# Patient Record
Sex: Female | Born: 1995 | Race: White | Hispanic: No | Marital: Single | Smoking: Never smoker
Health system: Southern US, Community
[De-identification: ages and names within clinical notes are randomized; demographics above are authoritative.]

## PROBLEM LIST (undated history)

## (undated) DIAGNOSIS — F419 Anxiety disorder, unspecified: Secondary | ICD-10-CM

## (undated) DIAGNOSIS — H539 Unspecified visual disturbance: Secondary | ICD-10-CM

## (undated) DIAGNOSIS — Z68.41 Body mass index (BMI) pediatric, greater than or equal to 95th percentile for age: Secondary | ICD-10-CM

## (undated) DIAGNOSIS — L709 Acne, unspecified: Secondary | ICD-10-CM

## (undated) DIAGNOSIS — M4200 Juvenile osteochondrosis of spine, site unspecified: Secondary | ICD-10-CM

## (undated) DIAGNOSIS — R51 Headache: Secondary | ICD-10-CM

## (undated) DIAGNOSIS — F909 Attention-deficit hyperactivity disorder, unspecified type: Secondary | ICD-10-CM

## (undated) HISTORY — DX: Anxiety disorder, unspecified: F41.9

## (undated) HISTORY — PX: DENTAL SURGERY: SHX609

## (undated) HISTORY — DX: Unspecified visual disturbance: H53.9

## (undated) HISTORY — DX: Body mass index (bmi) pediatric, greater than or equal to 95th percentile for age: Z68.54

## (undated) HISTORY — DX: Attention-deficit hyperactivity disorder, unspecified type: F90.9

## (undated) HISTORY — DX: Juvenile osteochondrosis of spine, site unspecified: M42.00

## (undated) HISTORY — DX: Acne, unspecified: L70.9

---
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
## 2016-02-13 ENCOUNTER — Encounter: Payer: Self-pay | Admitting: Pediatrics

## 2016-02-13 NOTE — Progress Notes (Signed)
Pre-Visit Planning  Jane PughVirginia [name] Roger Carter  is Carter 20 y.o. female referred by Jane QuarryWISELTON,[name], MD.   Last seen in [facility] on 11/24/2015 for PCOS, acne and hirsutism.   Previous Psych Screenings? Yes, PHQSADs 11/2015  Treatment plan at last visit included cont retinA, add benzaclin, cont OCP and spironolactone.   Clinical Staff Visit Tasks:   - Urine GC/CT due? no - Psych Screenings Due? No  Provider Visit Tasks: - Assess PCOS symptoms - Assess medication compliance, benefits and side effects  - BHC Involvement? No - Pertinent Labs? No  PCOS Labs & Referrals:   - Hgba1c annually if normal, every 3 months if abnormal:  Due 08/2016 - CMP annually if normal, as needed if abnormal:  Due 08/2016 - CBC if on metformin, annually if normal, as needed if abnormal:  Due 08/2016 - Lipid every 2 years if normal, annually if abnormal:  Due 08/2017 - Vitamin D annually if normal, as needed if abnormal: Due 08/2016 - Nutrition referral: Previously declined, can re-address NOW - BH Screening: Due 11/2016

## 2016-02-16 ENCOUNTER — Ambulatory Visit (INDEPENDENT_AMBULATORY_CARE_PROVIDER_SITE_OTHER): Payer: 59 | Admitting: Pediatrics

## 2016-02-16 ENCOUNTER — Encounter: Payer: Self-pay | Admitting: Pediatrics

## 2016-02-16 ENCOUNTER — Encounter: Payer: Self-pay | Admitting: *Deleted

## 2016-02-16 VITALS — BP 100/66 | HR 61 | Ht 68.0 in | Wt 177.8 lb

## 2016-02-16 DIAGNOSIS — E282 Polycystic ovarian syndrome: Secondary | ICD-10-CM

## 2016-02-16 DIAGNOSIS — L680 Hirsutism: Secondary | ICD-10-CM | POA: Diagnosis not present

## 2016-02-16 DIAGNOSIS — L700 Acne vulgaris: Secondary | ICD-10-CM | POA: Diagnosis not present

## 2016-02-16 NOTE — Patient Instructions (Signed)
Let us know if you need anything before your next visit.  You can use My Chart to contact us or call!

## 2016-02-16 NOTE — Progress Notes (Signed)
THIS RECORD MAY CONTAIN CONFIDENTIAL INFORMATION THAT SHOULD NOT BE RELEASED WITHOUT REVIEW OF THE SERVICE PROVIDER.  Adolescent Medicine Consultation Follow-Up Visit Jane CoinVirginia [name] Carter  is a 20 y.o. female referred by Marcene Corningwiselton, Louise, MD here today for follow-up.    Previsit planning completed:  Yes Pre-Visit Planning  Nicky PughVirginia [name] Carter ShelterGordon  is a 20 y.o. female referred by [name],Jane A, MD.   Last seen in [facility] on 11/24/2015 for PCOS, acne and hirsutism.   Previous Psych Screenings? Yes, PHQSADs 11/2015  Treatment plan at last visit included cont retinA, add benzaclin, cont OCP and spironolactone.   Clinical Staff Visit Tasks:   - Urine GC/CT due? no - Psych Screenings Due? No  Provider Visit Tasks: - Assess PCOS symptoms - Assess medication compliance, benefits and side effects  - BHC Involvement? No - Pertinent Labs? No  PCOS Labs & Referrals:   - Hgba1c annually if normal, every 3 months if abnormal:  Due 08/2016 - CMP annually if normal, as needed if abnormal:  Due 08/2016 - CBC if on metformin, annually if normal, as needed if abnormal:  Due 08/2016 - Lipid every 2 years if normal, annually if abnormal:  Due 08/2017 - Vitamin D annually if normal, as needed if abnormal: Due 08/2016 - Nutrition referral: Previously declined, can re-address NOW - BH Screening: Due 11/2016   Growth Chart Viewed? yes   History was provided by the patient.  PCP Confirmed?  Yes, Marcene CorningLouise Carter  My Chart Activated?   yes   HPI:    Acne fine since she started gel; using and helping a lot.  Getting laser hair removal on sides of face.  Cycle stable; no new concerns.  Going to [employer] in fall; not sexually active.    Patient's last menstrual period was 02/02/2016. No Known Allergies Outpatient Encounter Prescriptions as of 02/16/2016  Medication Sig Note  . clindamycin-benzoyl peroxide (BENZACLIN WITH PUMP) gel Apply topically daily. Apply to face every morning    . methylphenidate (RITALIN LA) 20 MG 24 hr capsule Take 1 capsule (20 mg total) by mouth every morning.   . norgestimate-ethinyl estradiol (ORTHO-CYCLEN, 28,) 0.25-35 MG-MCG tablet Take 1 tablet by mouth daily.   Marland Kitchen. spironolactone (ALDACTONE) 100 MG tablet Take 1.5 tablets (150 mg total) by mouth daily. 02/16/2016: Pt is taking 1 tab   No facility-administered encounter medications on file as of 02/16/2016.     Patient Active Problem List   Diagnosis Date Noted  . PCOS (polycystic ovarian syndrome) 05/20/2015  . Hirsutism 10/28/2014  . 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 BMI (body mass index), pediatric, 85% to less than 95% for age 69/04/2013  . Acne vulgaris 04/07/2013  . Thoracic back pain 12/13/2011  . 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  Physical Exam:  Filed Vitals:   02/16/16 1004  BP: 100/66  Pulse: 61  Height: 5\' 8"  (1.727 m)  Weight: 177 lb 12.8 oz (80.65 kg)  BP 100/66 mmHg  Pulse 61  Ht  (1.727 m)  Wt 177 lb 12.8 oz (80.65 kg)  BMI 27.04 kg/m2  LMP 02/02/2016 Body mass index: body mass index is 27.04 kg/(m^2). Blood pressure percentiles are 9% systolic and 46% diastolic based on 2000 NHANES data. Blood pressure percentile targets: 90: 127/81, 95: 131/85, 99 + 5 mmHg: 143/98.  Physical Exam  Constitutional: She is oriented to person, place, and time. She appears well-developed and well-nourished. No distress.  HENT:  Head: Normocephalic and atraumatic.  Nose: Nose normal.  Mouth/Throat: Oropharynx is  clear and moist. No oropharyngeal exudate.  Eyes: Conjunctivae and EOM are normal. Pupils are equal, round, and reactive to light.  Neck: Normal range of motion. Neck supple. No thyromegaly present.  Cardiovascular: Normal rate, regular rhythm, normal heart sounds and intact distal pulses.   No murmur heard. Pulmonary/Chest: Effort normal and breath sounds normal. She has no wheezes.  Abdominal: Soft. Bowel sounds are normal. She exhibits no distension and no mass. There is no tenderness.  Musculoskeletal: Normal range of motion. She exhibits no edema.  Lymphadenopathy:    She has no cervical adenopathy.  Neurological: She is alert and oriented to person, place, and time. She has normal reflexes. She exhibits normal muscle tone.  Skin: Skin is warm and dry. No rash noted.  mild inflammation noted on cheeks anterior to ears  Psychiatric: She has a normal mood and affect.  Vitals reviewed.    Assessment/Plan:  1. PCOS (polycystic ovarian syndrome) -stable on current.  -declines nutrition referral now -labs in September  2. Acne vulgaris -improved with benzaclin gel   3. Hirsutism -laser hair removal   Follow-up:  Return in about 3 months (around 05/18/2016) for PCOS management.   Medical decision-making:  >15 minutes spent, more than 50% of appointment was spent discussing diagnosis and management of symptoms

## 2016-03-20 ENCOUNTER — Encounter: Payer: Self-pay | Admitting: Family

## 2016-03-20 NOTE — Progress Notes (Signed)
Patient ID: Jane Carter, female   DOB: [birth year], 20 y.o.   MRN: [id number]  TC from mother regarding concern for patient's treatment of PCOS. Also questions if thyroid is of concern due to patient's weight. She reports that she has not received any information about her daughter's care and voices that she is aware of HIPPA regulations around release of information.  Advised mother that patient will have to approve release of information; she was told that patient needed to call in and speak to nurse about this further.  Mother verbalized understanding.

## 2016-03-20 NOTE — Progress Notes (Signed)
VM received from pt. States that her mother updated her that she would need to call the office in order and give permission for information to be given to her mom. Pt gives permission to release medical information to her mother. Pt can be reached at: [phone].

## 2016-05-09 ENCOUNTER — Ambulatory Visit: Payer: Self-pay | Admitting: Family

## 2016-05-16 ENCOUNTER — Ambulatory Visit: Payer: Self-pay | Admitting: Family

## 2016-05-17 ENCOUNTER — Ambulatory Visit (INDEPENDENT_AMBULATORY_CARE_PROVIDER_SITE_OTHER): Payer: 59 | Admitting: Family

## 2016-05-17 ENCOUNTER — Encounter: Payer: Self-pay | Admitting: Family

## 2016-05-17 VITALS — BP 96/70 | HR 71 | Ht 68.0 in | Wt 187.4 lb

## 2016-05-17 DIAGNOSIS — L680 Hirsutism: Secondary | ICD-10-CM

## 2016-05-17 DIAGNOSIS — L700 Acne vulgaris: Secondary | ICD-10-CM

## 2016-05-17 DIAGNOSIS — E282 Polycystic ovarian syndrome: Secondary | ICD-10-CM | POA: Diagnosis not present

## 2016-05-17 MED ORDER — CLINDAMYCIN PHOS-BENZOYL PEROX 1-5 % EX GEL: Freq: Every day | CUTANEOUS | Status: AC

## 2016-05-17 MED ORDER — SPIRONOLACTONE 100 MG PO TABS: 150.0000 mg | ORAL_TABLET | Freq: Every day | ORAL | Status: AC

## 2016-05-17 MED ORDER — NORGESTIMATE-ETH ESTRADIOL 0.25-35 MG-MCG PO TABS: 1.0000 | ORAL_TABLET | Freq: Every day | ORAL | Status: AC

## 2016-05-17 NOTE — Progress Notes (Signed)
THIS RECORD MAY CONTAIN CONFIDENTIAL INFORMATION THAT SHOULD NOT BE RELEASED WITHOUT REVIEW OF THE SERVICE PROVIDER.  Adolescent Medicine Consultation Follow-Up Visit Jane Carter  is a 20 y.o. female referred by Marcene Corning, MD here today for follow-up.    Previsit planning completed:  no  Growth Chart Viewed? yes   History was provided by the patient.  PCP Confirmed?  yes  My Chart Activated?   Yes, but patient forgot password. Will get reset today.   HPI:    "Jane Carter" says things going well. No concerns today. She just graduated from [employer] on Monday. She will be going to [place] to studying international business.   She remembers to take medications every day. No concerns about side effects.  Her periods are regular, every 4 months, lasts about 4 days, uses 2-4 pads/tampons a day.   Patient's last menstrual period was 04/27/2016 (approximate). No Known Allergies Outpatient Prescriptions Prior to Visit  Medication Sig Dispense Refill  . clindamycin-benzoyl peroxide (BENZACLIN WITH PUMP) gel Apply topically daily. Apply to face every morning 50 g 11  . norgestimate-ethinyl estradiol (ORTHO-CYCLEN, 28,) 0.25-35 MG-MCG tablet Take 1 tablet by mouth daily. 1 Package 11  . spironolactone (ALDACTONE) 100 MG tablet Take 1.5 tablets (150 mg total) by mouth daily. 45 tablet 3  . methylphenidate (RITALIN LA) 20 MG 24 hr capsule Take 1 capsule (20 mg total) by mouth every morning. (Patient not taking: Reported on 05/17/2016) 30 capsule 0   No facility-administered medications prior to visit.     Patient Active Problem List   Diagnosis Date Noted  . PCOS (polycystic ovarian syndrome) 05/20/2015  . Hirsutism 10/28/2014  . 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 BMI (body mass index), pediatric, 85% to less than 95% for age 52/04/2013  . Acne vulgaris 04/07/2013  . Thoracic back pain 12/13/2011  . 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  Enter confidential phone number in Family Comments section of [employer] Tobacco?  no Drugs/ETOH?  no Partner preference?  female Sexually Active?  no  Pregnancy Prevention:  birth control pills, reviewed condoms & plan B Trauma currently or in the pastt?  no Suicidal or Self-Harm thoughts?   no  The following portions of the patient's history were reviewed and updated as appropriate: allergies, current medications, past family history, past medical history, past social history, past surgical history and problem list.  Physical Exam:  Filed Vitals:   05/17/16 1342  BP: 96/70  Pulse: 71  Height:  (1.727 m)  Weight: 187 lb 6.3 oz (85 kg)   BP 96/70 mmHg  Pulse 71  Ht  (1.727 m)  Wt 187 lb 6.3 oz (85 kg)  BMI 28.50 kg/m2  LMP 04/27/2016 (Approximate) Body mass index: body mass index is 28.5 kg/(m^2). Blood pressure percentiles are 5% systolic and 62% diastolic based on 2000 NHANES data. Blood pressure percentile targets: 90: 127/81, 95: 131/85, 99 + 5 mmHg: 143/97.  Physical Exam  Constitutional: She is oriented to person, place, and time. She appears well-developed and well-nourished. No distress.  HENT:  Head: Normocephalic.  Mouth/Throat: Oropharynx is clear and moist.  Eyes: Conjunctivae are normal. Right eye exhibits no discharge. Left eye exhibits no discharge. No scleral icterus.  Neck: Neck supple. No thyromegaly present.  Cardiovascular: Normal rate, regular rhythm, normal heart sounds and intact distal pulses.   No murmur heard.  Pulmonary/Chest: Effort normal and breath sounds normal. No respiratory distress.  Abdominal: Soft. Bowel sounds are normal. She exhibits no distension. There is no tenderness.  Musculoskeletal: She exhibits no edema.  Neurological: She is alert and oriented to person, place, and time.  Skin: Skin is warm and  dry. No erythema.  Psychiatric: She has a normal mood and affect.     Assessment/Plan: 20 year old with PCOS, well controlled on OCPs and spironolactone. Doing well today, no concerns.  1. PCOS (polycystic ovarian syndrome) - norgestimate-ethinyl estradiol (ORTHO-CYCLEN, 28,) 0.25-35 MG-MCG tablet; Take 1 tablet by mouth daily.  Dispense: 1 Package; Refill: 11  2. Hirsutism - norgestimate-ethinyl estradiol (ORTHO-CYCLEN, 28,) 0.25-35 MG-MCG tablet; Take 1 tablet by mouth daily.  Dispense: 1 Package; Refill: 11 - spironolactone (ALDACTONE) 100 MG tablet; Take 1.5 tablets (150 mg total) by mouth daily.  Dispense: 45 tablet; Refill: 3  3. Acne vulgaris - clindamycin-benzoyl peroxide (BENZACLIN WITH PUMP) gel; Apply topically daily. Apply to face every morning  Dispense: 50 g; Refill: 11 - norgestimate-ethinyl estradiol (ORTHO-CYCLEN, 28,) 0.25-35 MG-MCG tablet; Take 1 tablet by mouth daily.  Dispense: 1 Package; Refill: 11   Follow-up:  Return in about 6 months (around 11/16/2016) for follow-up.   Medical decision-making:  > 15 minutes spent, more than 50% of appointment was spent discussing diagnosis and management of symptoms  E. Judson RochPaige Kalese Ensz, MD [facility], PGY-2 05/17/2016  2:13 PM

## 2016-05-17 NOTE — Patient Instructions (Signed)
PCOS (Polycystic Ovary Syndrome): General Information  PCOS is a common problem among teen girls and young women. In fact, almost 1 out of 10 women has PCOS.  What is PCOS?  Polycystic ovary syndrome (PCOS) is a hormone imbalance that can cause irregular periods, unwanted hair growth, and acne. PCOS begins during a girl's teen years and can be mild or severe.  What are the signs of PCOS?  Some of the most common signs of PCOS include:  Irregular periods that come every few months, not at all, or too frequently Extra hair on your face or other parts of your body, called hirsutism (her-suit-is-em) Acne Weight gain and/or trouble losing weight Patches of dark skin on the back of your neck and other areas, called acanthosis nigricans (a-can-tho-sis ni-gri-cans) Could I have PCOS?  If you have some or all of the above signs, you might have PCOS. There can be other reasons why you might have signs; however, only your health care provider can tell for sure.  What causes PCOS?  PCOS is caused by an imbalance in the hormones (chemical messengers) in your brain and your ovaries. PCOS usually happens when a hormone called LH (from the pituitary gland) or levels of insulin (from the pancreas) are too high, which then causes the ovaries to make extra amounts of testosterone.  For a more detailed explanation, take a look at the female reproductive anatomy image:  Female reproductive anatomy Female reproductive anatomy  The pituitary (pi-tu-i-tary) gland in your brain makes the hormones luteinizing (lu-tin-iz-ing) hormone (LH) and follicle (fall-i-call) stimulating hormone [facility]). After getting the signal from the hormones LH and FSH, the ovaries make estrogen (es-tro-gen) and progesterone (pro-ges-ter-own), the female sex hormones. All normal ovaries also make a little bit of the androgen testosterone (an-dro-gen tes-tos-ter-own), a female sex hormone. The pancreas (pang-cree-us) is an organ that  makes insulin. High levels of insulin can also cause the ovaries to make more of the hormone testosterone. Why are my periods so irregular?  Having PCOS means that your ovaries aren't getting the right (hormonal) signals from your pituitary gland. Without these signals, you won't ovulate (make eggs) every month. Your period may be irregular, or you may not have a period at all.  Let's review a regular menstrual cycle.  The menstrual cycle starts when the brain sends LH and FSH to the ovaries. A big surge of LH is the signal that causes the ovaries to ovulate, or release an egg. The egg travels down the fallopian tube and into the uterus. Progesterone from the ovary causes the lining of the uterus to thicken. If the egg isn't fertilized, the lining of the uterus is shed. This is a menstrual period. After the menstrual period, the cycle begins all over again. Regular vs. PCOS menstrual cycle Regular  menstrual cycle vs. PCOS menstrual cycle  The diagram on the left shows a regular menstrual cycle, and the diagram on the right shows a PCOS cycle with no ovulation.  Now, let's look at what happens during a menstrual cycle with PCOS.  With PCOS, LH levels are often high when the menstrual cycle starts. The levels of LH are also higher than FSH levels. Because the LH levels are already quite high, there is no LH surge. Without this LH surge, ovulation does not occur, and periods are irregular. Girls with PCOS may ovulate occasionally or not at all, so periods may be too close together, or more commonly too far apart. Some girls may not get  a period at all.  What types of tests will my health care provider do to diagnose PCOS?  Your health care provider will ask you a lot of questions about your menstrual cycle and your general health, and then do a complete physical examination. You will most likely need to have a blood test to check your hormone levels, blood sugar, and lipids (including  cholesterol). Your health care provider may also want you to have an ultrasound test. This is a test that uses sound waves to make a picture of your reproductive organs (ovaries and uterus) and bladder (where your urine is stored). In girls with PCOS, the ovaries may be slightly larger (often >10cc in volume) and have multiple tiny cysts.  Does PCOS mean I have cysts on my ovaries?  The term "polycystic ovaries" means that there are lots of tiny cysts, or bumps, inside of the ovaries. Some young women with PCOS have these cysts; others only have a few. Even if you do have lots of them, they're not harmful and they don't need to be removed.  Why do I get acne and/or extra hair on my body?  Acne and extra hair on your face and body can happen if your body is making too much testosterone. All women make testosterone, but if you have PCOS, your ovaries make a little bit more testosterone than they are supposed to. Skin cells and hair follicles can be extremely sensitive to the small increases in testosterone found in young women with PCOS.  Why do I have patches of dark skin?  Many adolescents with PCOS have higher levels of insulin in their blood. Higher levels of insulin can sometimes cause patches of darkened skin on the back of your neck, under your arms, and in your groin area (inside upper thighs).  Will PCOS affect my ability to have children some day?  Women with PCOS have a normal uterus and healthy eggs. Many women with PCOS have trouble getting pregnant, but some women have no trouble at all. If you're concerned about your fertility (ability to get pregnant) in the future, talk to your health care provider about all the new options available, including medications to lower your insulin levels or to help you ovulate each month.  What can I do about having PCOS?  The most important treatment for PCOS is working towards a healthy lifestyle that includes healthy eating and daily exercise.  There are also excellent medications to help you manage irregular periods, hair growth, and acne. Ask your health care provider about the various treatment options.  What is the treatment for PCOS?  The most common form of treatment for PCOS is the birth control pill; however, other kinds of hormonal therapy may include the "vaginal ring" and "the patch". Even if you're not sexually active, birth control pills may be prescribed because they contain the hormones that your body needs to treat your PCOS. Birth control pills (either taken continuously or in cycles) can:  Correct the hormone imbalance Lower the level of testosterone (which will improve acne and lessen hair growth) Regulate your menstrual periods Lower the risk of endometrial cancer (which is slightly higher in young women who don't ovulate regularly) Prevent an unplanned pregnancy if you are sexually active Is there any other medicine to treat PCOS?  A medicine which helps the body lower the insulin level is called Metformin. It's particularly helpful in girls who have high levels of insulin, or have pre-diabetes or diabetes. Some girls are  treated with both Metformin and birth control pills at the same time.  Ask your health care provider about treating hair growth. Only you and your health care provider can decide which treatment is right for you. Options may include bleaching, waxing, depilatories, spironolactone (spi-ro-no-lac-tone), electrolysis, and laser treatment. Spironolactone is a prescription medicine that can lessen hair growth and make hair lighter and finer. However, it can take up to 6-8 months to see an improvement.  Ask your health care provider about treatment for acne. There are various ways to treat acne, including the birth control pill, topical creams, oral antibiotics, and other medications.  Ask your health care provider about a weight loss plan if you are overweight. If you're overweight, losing weight may  lessen some of the symptoms of PCOS. Talk to your health care provider or nutritionist about healthy ways to lose weight such as exercising more and following a nutrition plan that helps manage insulin levels. Healthy eating can also keep your heart healthy and lower your risk of developing diabetes.  Weight Management Tips:  Choose nutritious, high-fiber carbohydrates instead of sugary or refined carbohydrates Balance carbohydrates with protein and healthy fats Eat small meals and snacks throughout the day instead of large meals Exercise regularly to help manage insulin levels and your weight Top 10 PCOS Tips What if I have worries about having PCOS?  If you've been told you have PCOS, you may feel frustrated or sad. You may also feel relieved that at last there is a reason and treatment for the problems you have been having, especially if you have had a hard time keeping a normal weight, or you have excess body hair, acne, or irregular periods. Having a diagnosis without an easy cure can be difficult. However, it's important for girls with PCOS to know they are not alone. Finding a health care provider who knows a lot about PCOS and is someone you feel comfortable talking to is very important. Keeping a positive attitude and working on a healthy lifestyle even when results seem to take a long time is very important, too! Many girls with PCOS tell us that talking with a counselor about their concerns can be very helpful. Other girls recommend online chats. [facility] offers a free and confidential monthly chat for girls and young women with PCOS.  What else do I need to know?  It's important to follow-up regularly with your health care provider and make sure you take all the medications prescribed to regulate your periods and lessen your chance of getting diabetes or other health problems. Because you have a slightly higher chance of developing diabetes, your health care  provider may suggest that you have your blood sugar tested once a year, or have a glucose challenge test every few years. Quitting smoking (or never starting) will also improve your overall health. Because you have a higher chance of developing diabetes, your health care provider may suggest having a:  Blood sugar test once a year A1C test (a test that tells how high your blood sugar has been the past 2-3 months) once a year Glucose tolerance test every few years

## 2016-06-08 ENCOUNTER — Telehealth: Payer: Self-pay | Admitting: *Deleted

## 2016-06-08 ENCOUNTER — Encounter: Payer: Self-pay | Admitting: *Deleted

## 2016-06-08 NOTE — Telephone Encounter (Addendum)
VM from mom, requesting print off of pt's most recent lab work for specialist. Mom would like to pick up copy from office.   Pt is now 20 y.o. Pt was sent MyChart message regarding recent lab work, and advised how to print off her labs. See additional documentation.

## 2016-06-12 ENCOUNTER — Telehealth: Payer: Self-pay | Admitting: *Deleted

## 2016-06-12 NOTE — Telephone Encounter (Signed)
Mom called to follow up on last call where she requested records to take to another specialist.  Referred to last encounter where MB documents that she sent an email to the patient who is 20 yrs old and told her to check her mychart and print off her labs from there. Mom feels that SHE should have received a call with this information (check mychart) and is very upset stating that her daughter has not been helped here, that she is doing worse and gained weight and that she is going to have to "start all over".  Mom is requesting a call back from Dr. Marina GoodellPerry. I told her that she was not in the clinic today.

## 2016-06-12 NOTE — Telephone Encounter (Signed)
Routed to covering providers, as Dr. Marina GoodellPerry is not available.

## 2016-06-29 ENCOUNTER — Encounter: Payer: Self-pay | Admitting: Pediatrics

## 2016-06-29 NOTE — Telephone Encounter (Signed)
VM received from mom. States that she would like a call back from Dr. Marina Goodell to discuss care management.

## 2016-07-03 NOTE — Telephone Encounter (Signed)
For mother that I am available today 07/03/2016 to talk and will return again to [facility] on 07/17/2016.

## 2016-08-08 ENCOUNTER — Telehealth: Payer: Self-pay | Admitting: *Deleted

## 2016-08-08 NOTE — Telephone Encounter (Signed)
VM from mom. States that pt has called home needing her ADHD meds refilled. Mom reports that per PCP, pt has aged out of their practice and are not able to fill rx. PCP advised mom to call Dr. Marina GoodellPerry, as we have been managing her care. Mom states pt will be home from college in mid-October.   Callback: [phone].

## 2016-08-09 NOTE — Telephone Encounter (Signed)
OK to schedule patient when convenient for her. Advise if further questions.

## 2016-12-19 ENCOUNTER — Telehealth: Payer: Self-pay | Admitting: *Deleted

## 2016-12-19 NOTE — Telephone Encounter (Signed)
Patient mom is calling and wanting to know if someone could write up a 504 plan for [employer] classes. Mom can be reached at [phone] with any questions.

## 2016-12-21 NOTE — Telephone Encounter (Signed)
Scheduled for 1/26 appointment to discuss ADHD.

## 2016-12-29 ENCOUNTER — Ambulatory Visit: Payer: 59 | Admitting: Family
